# Patient Record
Sex: Male | Born: 2000 | Race: White | Hispanic: No | Marital: Single | Smoking: Never smoker
Health system: Southern US, Community
[De-identification: ages and names within clinical notes are randomized; demographics above are authoritative.]

## PROBLEM LIST (undated history)

## (undated) DIAGNOSIS — J9383 Other pneumothorax: Secondary | ICD-10-CM

## (undated) HISTORY — DX: Other pneumothorax: J93.83

---
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
## 2022-09-24 IMAGING — DX DG CHEST 2V
2 series · 2 of 2 positions shown · non-contrast
Comparison: 07/14/2020

CLINICAL DATA: History of apical bleb with pleurectomy.

EXAM:
CHEST - 2 VIEW

[dg chest 2 view (1 of 2)]
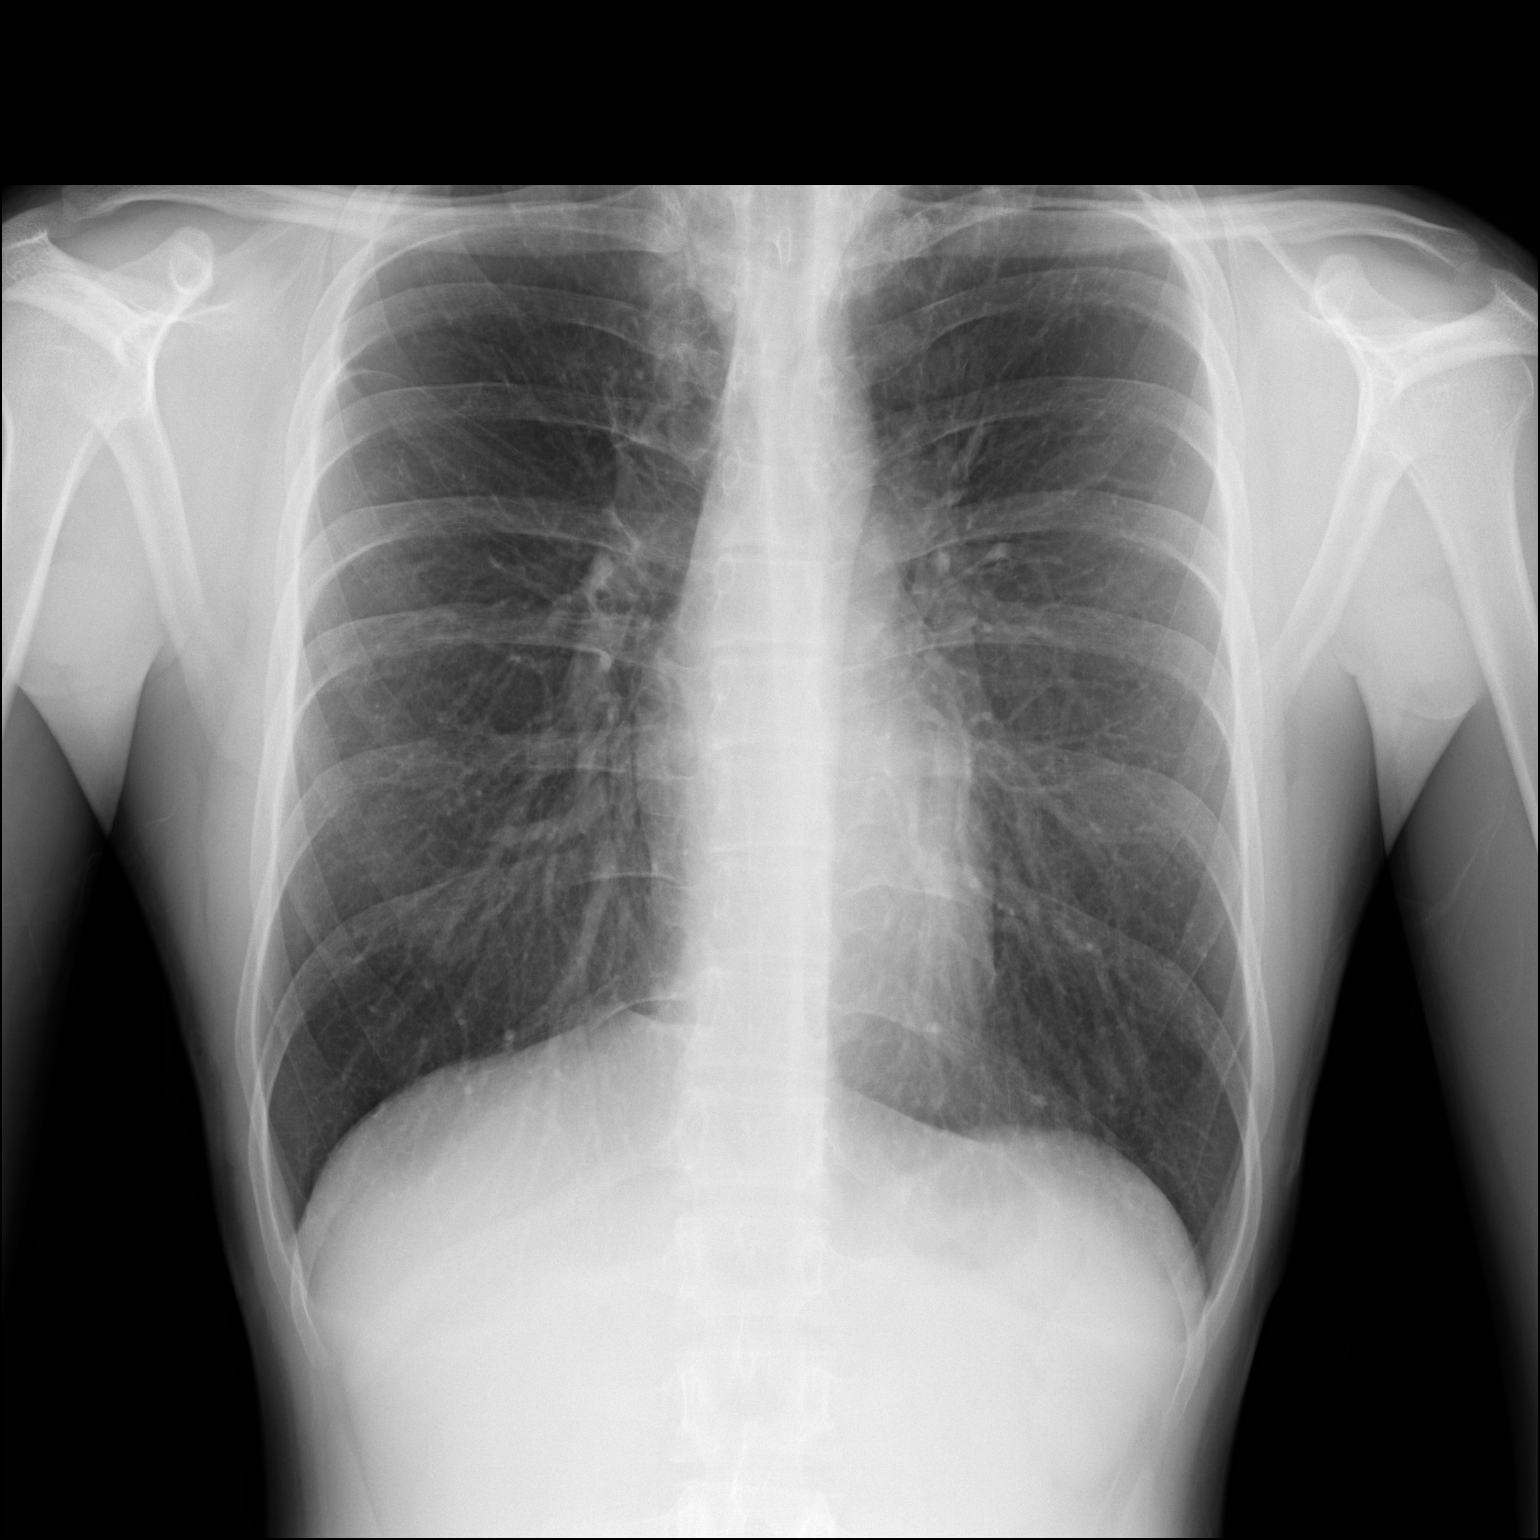

[dg chest 2 view (2 of 2)]
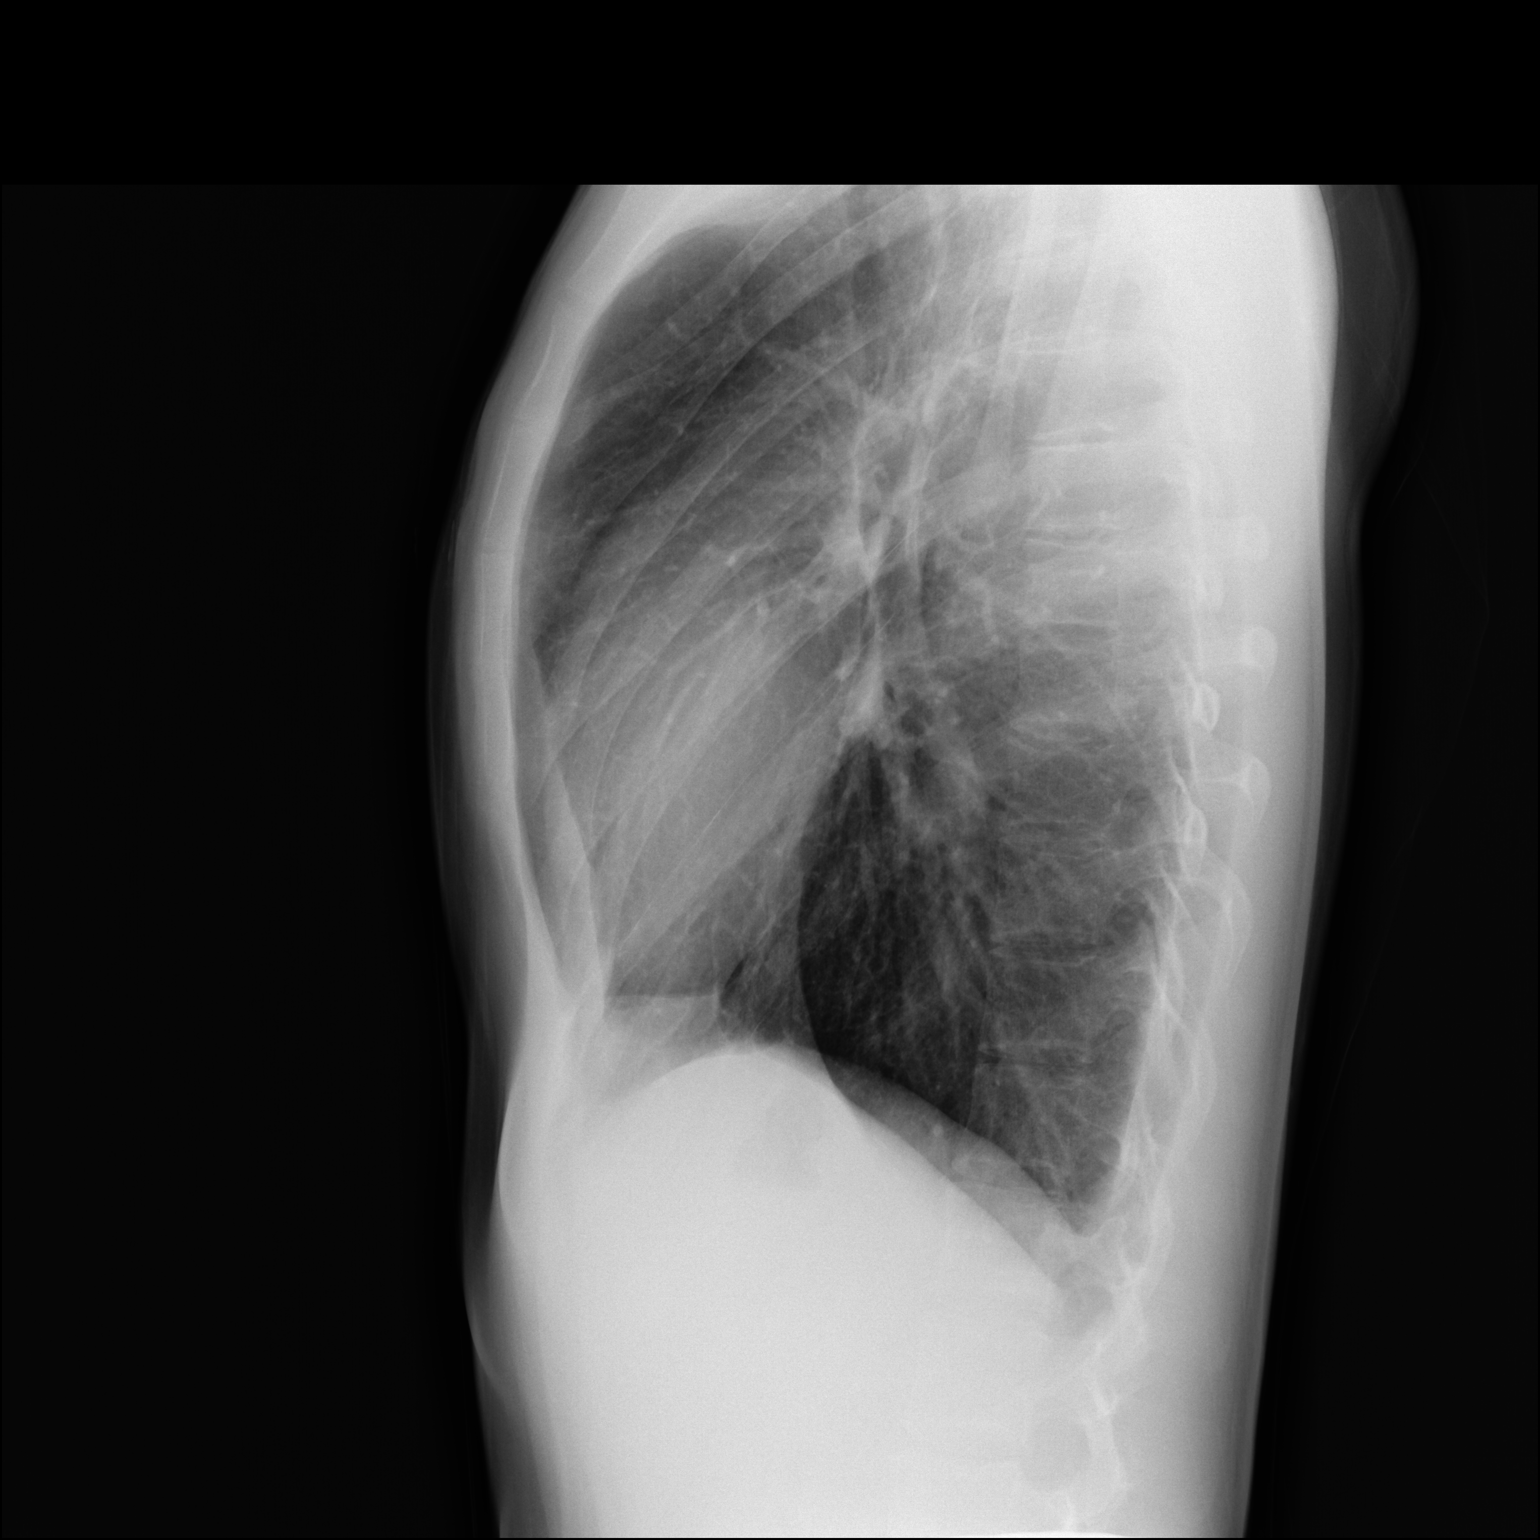

[2 of 2 positions shown; findings below may reference images not displayed]

FINDINGS: Cardiomediastinal silhouette is within normal limits. No
consolidation. No visible pleural effusion or pneumothorax.
Bilateral apical chain sutures. No acute osseous abnormality.
IMPRESSION: No acute cardiopulmonary disease.  No visible pneumothorax.
